# Patient Record
Sex: Female | Born: 1965 | Race: Black or African American | Hispanic: No | Marital: Single | Smoking: Never smoker
Health system: Southern US, Community
[De-identification: ages and names within clinical notes are randomized; demographics above are authoritative.]

## PROBLEM LIST (undated history)

## (undated) DIAGNOSIS — G459 Transient cerebral ischemic attack, unspecified: Secondary | ICD-10-CM

## (undated) DIAGNOSIS — I10 Essential (primary) hypertension: Secondary | ICD-10-CM

---

## 2009-11-25 ENCOUNTER — Inpatient Hospital Stay (HOSPITAL_COMMUNITY): Admission: AD | Admit: 2009-11-25 | Discharge: 2009-11-25 | Payer: Self-pay | Admitting: Obstetrics & Gynecology

## 2011-03-13 LAB — URINALYSIS, ROUTINE W REFLEX MICROSCOPIC
Glucose, UA: NEGATIVE mg/dL
Hgb urine dipstick: NEGATIVE
Protein, ur: NEGATIVE mg/dL

## 2011-03-13 LAB — POCT PREGNANCY, URINE: Preg Test, Ur: NEGATIVE

## 2013-08-19 ENCOUNTER — Other Ambulatory Visit: Payer: Self-pay | Admitting: Internal Medicine

## 2013-08-19 DIAGNOSIS — Z1231 Encounter for screening mammogram for malignant neoplasm of breast: Secondary | ICD-10-CM

## 2013-09-11 ENCOUNTER — Ambulatory Visit
Admission: RE | Admit: 2013-09-11 | Discharge: 2013-09-11 | Disposition: A | Discharge status: Home / Self Care | Payer: Medicaid Other | Source: Ambulatory Visit | Attending: Internal Medicine | Admitting: Internal Medicine

## 2013-09-11 DIAGNOSIS — Z1231 Encounter for screening mammogram for malignant neoplasm of breast: Secondary | ICD-10-CM

## 2014-02-24 ENCOUNTER — Emergency Department (HOSPITAL_COMMUNITY)
Admission: EM | Admit: 2014-02-24 | Discharge: 2014-02-24 | Disposition: A | Discharge status: Home / Self Care | Payer: Medicaid Other | Attending: Emergency Medicine | Admitting: Emergency Medicine

## 2014-02-24 ENCOUNTER — Emergency Department (HOSPITAL_COMMUNITY): Payer: Medicaid Other

## 2014-02-24 ENCOUNTER — Encounter (HOSPITAL_COMMUNITY): Payer: Self-pay | Admitting: Emergency Medicine

## 2014-02-24 DIAGNOSIS — Y92009 Unspecified place in unspecified non-institutional (private) residence as the place of occurrence of the external cause: Secondary | ICD-10-CM | POA: Insufficient documentation

## 2014-02-24 DIAGNOSIS — M25569 Pain in unspecified knee: Secondary | ICD-10-CM | POA: Insufficient documentation

## 2014-02-24 DIAGNOSIS — Y9389 Activity, other specified: Secondary | ICD-10-CM | POA: Insufficient documentation

## 2014-02-24 DIAGNOSIS — I10 Essential (primary) hypertension: Secondary | ICD-10-CM | POA: Insufficient documentation

## 2014-02-24 DIAGNOSIS — S86919A Strain of unspecified muscle(s) and tendon(s) at lower leg level, unspecified leg, initial encounter: Secondary | ICD-10-CM

## 2014-02-24 DIAGNOSIS — Z8673 Personal history of transient ischemic attack (TIA), and cerebral infarction without residual deficits: Secondary | ICD-10-CM | POA: Insufficient documentation

## 2014-02-24 DIAGNOSIS — IMO0002 Reserved for concepts with insufficient information to code with codable children: Secondary | ICD-10-CM | POA: Insufficient documentation

## 2014-02-24 DIAGNOSIS — R609 Edema, unspecified: Secondary | ICD-10-CM | POA: Insufficient documentation

## 2014-02-24 DIAGNOSIS — X58XXXA Exposure to other specified factors, initial encounter: Secondary | ICD-10-CM | POA: Insufficient documentation

## 2014-02-24 HISTORY — DX: Transient cerebral ischemic attack, unspecified: G45.9

## 2014-02-24 HISTORY — DX: Essential (primary) hypertension: I10

## 2014-02-24 MED ORDER — IBUPROFEN 800 MG PO TABS: 800.0000 mg | ORAL_TABLET | Freq: Three times a day (TID) | ORAL | Status: AC | PRN

## 2014-02-24 MED ORDER — IBUPROFEN 800 MG PO TABS
800.0000 mg | ORAL_TABLET | Freq: Once | ORAL | Status: AC
  Administered 2014-02-24: 800 mg via ORAL
  Filled 2014-02-24: qty 1

## 2014-02-24 MED ORDER — IBUPROFEN 800 MG PO TABS: 800.0000 mg | ORAL_TABLET | Freq: Three times a day (TID) | ORAL | Status: DC

## 2014-02-24 NOTE — ED Notes (Signed)
Pt reports R knee/calf pain since last night. Pain woke pt up last night, denies known injury. Sts pain is unchanged with movement or ambulation. Sts also had the same knee "give out" on her last month while walking.

## 2014-02-24 NOTE — ED Notes (Signed)
During interview with MD, pt sts pain started when walking to bathroom and is worse with walking, changed from RN interview.

## 2014-02-24 NOTE — ED Provider Notes (Signed)
CSN: [id number]     Arrival date & time 02/24/14  0857 History   First MD Initiated Contact with Patient 02/24/14 [phone]     Chief Complaint  Patient presents with  . Leg Pain     (Consider location/radiation/quality/duration/timing/severity/associated sxs/prior Treatment) Patient is a 48 y.o. female presenting with leg pain. The history is provided by the patient.  Leg Pain Location:  Knee and leg Injury: no   Leg location:  R lower leg Knee location:  R knee Pain details:    Quality:  Aching   Radiates to:  Does not radiate   Severity:  Mild   Onset quality:  Sudden   Duration:  1 day   Timing:  Constant (with ambulation)   Progression:  Unchanged Chronicity:  New Dislocation: no   Foreign body present:  No foreign bodies Prior injury to area:  No Relieved by:  Rest Worsened by:  Bearing weight Ineffective treatments:  None tried Associated symptoms: no decreased ROM, no fever, no neck pain, no numbness, no swelling and no tingling     Past Medical History  Diagnosis Date  . Hypertension   . TIA (transient ischemic attack)    History reviewed. No pertinent past surgical history. No family history on file. History  Substance Use Topics  . Smoking status: Not on file  . Smokeless tobacco: Not on file  . Alcohol Use: Not on file  Nonsmoker, social drinker OB History   Grav Para Term Preterm Abortions TAB SAB Ect Mult Living                 Review of Systems  Constitutional: Negative for fever.  Respiratory: Negative for cough and shortness of breath.   Musculoskeletal: Negative for neck pain.  All other systems reviewed and are negative.      Allergies  Review of patient's allergies indicates not on file.  Home Medications  No current outpatient prescriptions on file. BP 132/48  Pulse 88  Temp(Src) 98.7 F (37.1 C) (Oral)  Resp 20  SpO2 96% Physical Exam  Nursing note and vitals reviewed. Constitutional: She is oriented to person, place, and  time. She appears well-developed and well-nourished. No distress.  Morbidly obese  HENT:  Head: Normocephalic and atraumatic.  Eyes: EOM are normal. Pupils are equal, round, and reactive to light.  Neck: Normal range of motion. Neck supple.  Cardiovascular: Normal rate and regular rhythm.  Exam reveals no friction rub.   No murmur heard. Pulmonary/Chest: Effort normal and breath sounds normal. No respiratory distress. She has no wheezes. She has no rales.  Abdominal: Soft. She exhibits no distension. There is no tenderness. There is no rebound.  Musculoskeletal: Normal range of motion. She exhibits edema (trace pitting edema bilaterally).       Right knee: She exhibits normal range of motion, no swelling, no effusion, no ecchymosis and no deformity. Tenderness (inferior to patella, anterior superior tibia, mild lateral superior tibia/fibula) found. Patellar tendon tenderness noted.  No calf swelling  Neurological: She is alert and oriented to person, place, and time.  Skin: She is not diaphoretic.    ED Course  Procedures (including critical care time) Labs Review Labs Reviewed - No data to display Imaging Review No results found.   EKG Interpretation None      MDM   Final diagnoses:  Knee pain  Knee strain    48 year old female presents with right knee pain. She is morbidly obese. Pain started when she was walking to  the bathroom. No known injury, heavy lifting, trauma. No history of DVT. On exam vitals are stable. She has pain on her patellar tendon, superior tibia, lateral fibula. No swelling in the right leg when compared to the left leg. Both legs have trace bilateral pitting edema. No tenderness along the deep venous system or in the calf musculature posteriorly. She has 0 Wells criteria for DVT. I think this is likely musculoskeletal strain secondary to her morbidly obese. Will x-ray the knee and give NSAIDs. Will give ACE wrap to provide support. Knee xray normal. Given  NSAIDs and resource guide.    Dagmar HaitWilliam Kayven Aldaco, MD 02/24/14 1000

## 2014-02-24 NOTE — Discharge Instructions (Signed)
Knee Sprain A knee sprain is a tear in one of the strong, fibrous tissues that connect the bones (ligaments) in your knee. The severity of the sprain depends on how much of the ligament is torn. The tear can be either partial or complete. CAUSES  Often, sprains are a result of a fall or injury. The force of the impact causes the fibers of your ligament to stretch too much. This excess tension causes the fibers of your ligament to tear. SIGNS AND SYMPTOMS  You may have some loss of motion in your knee. Other symptoms include:  Bruising.  Pain in the knee area.  Tenderness of the knee to the touch.  Swelling. DIAGNOSIS  To diagnose a knee sprain, your health care provider will physically examine your knee. Your health care provider may also suggest an X-ray exam of your knee to make sure no bones are broken. TREATMENT  If your ligament is only partially torn, treatment usually involves keeping the knee in a fixed position (immobilization) or bracing your knee for activities that require movement for several weeks. To do this, your health care provider will apply a bandage, cast, or splint to keep your knee from moving and to support your knee during movement until it heals. For a partially torn ligament, the healing process usually takes 4 6 weeks. If your ligament is completely torn, depending on which ligament it is, you may need surgery to reconnect the ligament to the bone or reconstruct it. After surgery, a cast or splint may be applied and will need to stay on your knee for 4 6 weeks while your ligament heals. HOME CARE INSTRUCTIONS  Keep your injured knee elevated to decrease swelling.  To ease pain and swelling, apply ice to the injured area:  Put ice in a plastic bag.  Place a towel between your skin and the bag.  Leave the ice on for 20 minutes, 2 3 times a day.  Only take medicine for pain as directed by your health care provider.  Do not leave your knee unprotected until  pain and stiffness go away (usually 4 6 weeks).  If you have a cast or splint, do not allow it to get wet. If you have been instructed not to remove it, cover it with a plastic bag when you shower or bathe. Do not swim.  Your health care provider may suggest exercises for you to do during your recovery to prevent or limit permanent weakness and stiffness. SEEK IMMEDIATE MEDICAL CARE IF:  Your cast or splint becomes damaged.  Your pain becomes worse.  You have significant pain, swelling, or numbness below the cast or splint. MAKE SURE YOU:  Understand these instructions.  Will watch your condition.  Will get help right away if you are not doing well or get worse. Document Released: 11/27/2005 Document Revised: 09/17/2013 Document Reviewed: 07/09/2013 [facility] Patient Information 2014 [employer] [place].   Emergency Department Resource Guide 1) Find a Doctor and Pay Out of Pocket Although you won't have to find out who is covered by your insurance plan, it is a good idea to ask around and get recommendations. You will then need to call the office and see if the doctor you have chosen will accept you as a new patient and what types of options they offer for patients who are self-pay. Some doctors offer discounts or will set up payment plans for their patients who do not have insurance, but you will need to ask so you aren't  surprised when you get to your appointment.  2) Contact Your Local Health Department Not all health departments have doctors that can see patients for sick visits, but many do, so it is worth a call to see if yours does. If you don't know where your local health department is, you can check in your phone book. The CDC also has a tool to help you locate your state's health department, and many state websites also have listings of all of their local health departments.  3) Find a Walk-in Clinic If your illness is not likely to be very severe or complicated, you may want to  try a walk in clinic. These are popping up all over the country in pharmacies, drugstores, and shopping centers. They're usually staffed by nurse practitioners or physician assistants that have been trained to treat common illnesses and complaints. They're usually fairly quick and inexpensive. However, if you have serious medical issues or chronic medical problems, these are probably not your best option.  No Primary Care Doctor: - Call Health Connect at  [phone] - they can help you locate a primary care doctor that  accepts your insurance, provides certain services, etc. - Physician Referral Service- [phone]  Chronic Pain Problems: Organization         Address  Phone   Notes  [facility]  [phone] Patients need to be referred by their primary care doctor.   Medication Assistance: Organization         Address  Phone   Notes  [facility] [address] [place] [place], [place] [address] [phone] --Must be a resident of [facility] -- Must have NO insurance coverage whatsoever (no Medicaid/ Medicare, etc.) -- The pt. MUST have a primary care doctor that directs their care regularly and follows them in the community   [facility]ist  [phone]   [employer]  [phone]    Agencies that provide inexpensive medical care: Organization         Address  Phone   Notes  [facility]  [phone]   [facility]    [phone]   [facility] [address] [place], [place] [address] [phone]   [facility] [address] [place] [place] [phone]   Planned Parenthood    [phone]   [facility]    [phone]   [facility] and [facility]  [address], [place] Phone:  [phone], Fax:  [phone] Hours of Operation:  9 am - 6 pm, M-F.  Also accepts Medicaid/Medicare and self-pay.  [facility]  [address], [place] Phone: [phone], Fax: [phone]. Hours of Operation:  8:30 am - 5:30 pm, M-F.  Also accepts Medicaid and self-pay.  [facility] [place] [address], [place] Phone: [phone]   [facility] [address] Natasha Bence [place], [place] [phone], Ext. [phone] Mondays & Thursdays: 7-9 AM.  First 15 patients are seen on a first come, first serve basis.    Medicaid-accepting [facility] Providers:  Organization         Address  Phone   Notes  [facility] [address], [place] [phone] Also accepts self-pay patients.  [facility] [address] Laurell Josephs [place], [place]  [phone]   [facility]  [facility] [address], [place] [phone]   [facility] [address], [place] [phone]   Renaye Rakers [address], [place]   [phone] Only accepts [place] patients after they have their name applied to their card.   Self-Pay (no insurance) in [facility]:  Organization         Address  Phone   Notes  Sickle Cell Patients, [facility] [address] [place], [place] [phone]   [facility] [address] [place], [place] [phone]   [facility] [place]  [address], [place] [phone]   [facility]/Dr. Osei-Bonsu  [address] [place] or [address], [place] [phone] Phone number for both [place] and [place] locations is the same.  [facility] [address], [place] [phone]   [facility] [address] [place] or [address] [phone]   [facility] [address], [place] [phone], phone; [phone], fax  Sees patients 1st and 3rd Saturday of every month.  Must not qualify for public or private insurance (i.e. Medicaid, Medicare, [place] Health Choice, Veterans' Benefits)  Household income should be no more than 200% of the poverty level The clinic cannot treat you if you are pregnant or think you are pregnant  Sexually transmitted diseases are not treated at the clinic.    Dental Care: Organization         Address  Phone  Notes  [facility] Department of [facility] [address] [place], [place] [phone] Accepts children up to age 76 who are enrolled in [place] or [place] Health Choice; pregnant women with a Medicaid card; and children who have applied for Medicaid or [place] Health Choice, but were declined, whose parents can pay a reduced fee at time of service.  [facility] Department of [facility]  [address], [place] [phone] Accepts children up to age 56 who are enrolled in [place] or [place] Health Choice; pregnant women with a Medicaid card; and children who have applied for Medicaid or [place] Health Choice, but were declined, whose parents can pay a reduced fee at time of service.  [facility]  [address] [place], [place] [phone] Patients are seen by appointment only. Walk-ins are not accepted. [facility] will see patients 45 years of age and older. Monday - Tuesday (8am-5pm) Most Wednesdays (8:30-5pm) $30 per visit, cash only  [facility]  [address], [facility] [phone] Patients are seen by appointment only. Walk-ins are not accepted. [facility] will see patients 6 years of age and older. One Wednesday Evening (Monthly: Volunteer Based).  $30 per visit, cash only  [employer]  [phone] for adults; Children under age 34, call Graduate Pediatric Dentistry at [phone]. Children aged 4-14, please call [phone] to  request a pediatric application.  Dental services are provided in all areas of dental care including fillings, crowns and bridges, complete and partial dentures, implants, gum treatment, root canals, and extractions. Preventive care is also provided. Treatment is provided to both adults and children. Patients are selected via a lottery and there is often a waiting list.  [facility] [address], [place]  [phone] [email or web address]   [facility] [address], [place], [place] [phone], Ext. [phone] Second and Fourth Thursday of each month, opens at 6:30 AM; Clinic ends at 9 AM.  Patients are seen on a first-come first-served basis, and a limited number are seen during each clinic.   [facility]  [address], [place], [place] [phone]   Eligibility Requirements You must have lived in [place], [place], or [place] counties for at least the last three months.   You cannot be eligible for state or federal sponsored National Cityhealthcare insurance, including CIGNAVeterans Administration, [place]icaid, or [employer]Medicare.   You generally cannot be eligible for healthcare insurance through your employer.    How to apply: Eligibility screenings are held every Tuesday and Wednesday afternoon from 1:00 pm until 4:00 pm. You do not need an appointment for the interview!  [facility] [address], [place], [place] [phone]   [facility]  [phone]   [facility]  [phone]   [facility]  [phone]    Behavioral Health Resources in the Community: Intensive Outpatient Programs Organization         Address  Phone  Notes  [facility] [address], [place], [place] [phone]   [facility] [address], [place], [place] [phone]   [facility] [address], [place], [place]  [phone]   [facility] [address],  [place], [place] [phone] or [phone]   Substance Abuse Resources Organization         Address  Phone  Notes  [facility]  [phone]   [facility]  [phone]   [facility]  [phone]   [facility]  [phone]   Residential & Outpatient Substance Abuse Program  [phone]   Psychological Services Organization         Address  Phone  Notes  [facility]  [phone]- [phone]   [facility]  [phone]   [facility] [address], [place] [phone] or [phone]    Mobile Crisis Teams Organization         Address  Phone  Notes  [facility], Mobile Crisis Care Unit  [phone]   [facility]  [address] [place], [place] [phone]   Doristine LocksSharon DeEsch [address] [place] [phone]    Self-Help/Support Groups Organization         Address  Phone             Notes  [facility] [place] - variety of support groups  336- [phone] Call for more information  Narcotics Anonymous (NA), [facility] [address], [place]  2 meetings at this location   Statisticianesidential Treatment Programs Organization         Address  Phone  Notes  [facility] [address],    [place]  [phone]   [facility]  [address], [place] [phone], [place], [place] [phone]   [facility] [address], [place] [phone] Admissions: 8am-3pm M-F  [facility] [address]    [place], [place] [phone]   [facility] [address], [place], [place] [phone]   [facility] [address]  [place], [place] [phone]   [facility] - Intensive Outpatient [address] Laurell Josephs [address], [place], [place] [phone]   [facility] ([facility] [address] [place].,  [place], [place] [phone] or [phone]   [facility] ([facility]) [address] [place], [place] [phone] Accepts Medicaid  [facility] [address],  [place] [phone] Substance Abuse/Addiction Treatment   [facility] Organization         Address  Phone  Notes  [facility]  [phone]   Angie Fava, PhD [address] Ervin Knack [place], [place]   [phone] or [phone]   [facility]   [address] [place], [place] [phone]   [facility] [address] [place], [place] [phone] Insurance/Medicaid/sponsorship through [facility] and Families [address]                                    [place], [place] [phone] Therapy/tele-psych/case  [facility] [address], [place] [phone]    Dr. Lolly Mustache  [phone]   [facility]  [employer] [facility] 1) [address] [place] 2) [address] [place] 3)  [address], [place] [phone]  [phone]   [facility] Child Abuse Hotline 514-026-7607 or [phone] (After Hours)

## 2014-02-24 NOTE — ED Notes (Signed)
MD at bedside. 

## 2014-03-16 ENCOUNTER — Encounter (HOSPITAL_COMMUNITY): Payer: Self-pay | Admitting: Emergency Medicine

## 2014-03-16 ENCOUNTER — Emergency Department (HOSPITAL_COMMUNITY)
Admission: EM | Admit: 2014-03-16 | Discharge: 2014-03-16 | Disposition: A | Discharge status: Home / Self Care | Payer: Medicaid Other | Attending: Emergency Medicine | Admitting: Emergency Medicine

## 2014-03-16 ENCOUNTER — Other Ambulatory Visit (HOSPITAL_COMMUNITY): Payer: Self-pay | Admitting: Physician Assistant

## 2014-03-16 DIAGNOSIS — Z8673 Personal history of transient ischemic attack (TIA), and cerebral infarction without residual deficits: Secondary | ICD-10-CM | POA: Insufficient documentation

## 2014-03-16 DIAGNOSIS — Z79899 Other long term (current) drug therapy: Secondary | ICD-10-CM | POA: Insufficient documentation

## 2014-03-16 DIAGNOSIS — N6452 Nipple discharge: Secondary | ICD-10-CM

## 2014-03-16 DIAGNOSIS — I10 Essential (primary) hypertension: Secondary | ICD-10-CM | POA: Insufficient documentation

## 2014-03-16 DIAGNOSIS — N643 Galactorrhea not associated with childbirth: Secondary | ICD-10-CM | POA: Insufficient documentation

## 2014-03-16 DIAGNOSIS — Z7982 Long term (current) use of aspirin: Secondary | ICD-10-CM | POA: Insufficient documentation

## 2014-03-16 MED ORDER — CEPHALEXIN 500 MG PO CAPS: ORAL_CAPSULE | ORAL | Status: AC

## 2014-03-16 NOTE — ED Provider Notes (Signed)
CSN: [id number]     Arrival date & time 03/16/14  1144 History   First MD Initiated Contact with Patient 03/16/14 1157     Chief Complaint  Patient presents with  . Breast Discharge     (Consider location/radiation/quality/duration/timing/severity/associated sxs/prior Treatment) HPI Comments: Patient is a 48 year old female with history of hypertension and TIA who presents today with sudden onset of light brown discharge from the right nipple. This occurred just prior to arrival and has persisted. She is not currently having any discharge. She denies any pain. She had a normal mammogram 4 months ago. She does have a positive family history of her sister being diagnosed with breast cancer "years ago". She denies fever, chills, nausea, vomiting, abdominal pain, chest pain, shortness of breath, generalized malaise, body aches.  The history is provided by the patient. No language interpreter was used.    Past Medical History  Diagnosis Date  . Hypertension   . TIA (transient ischemic attack)    History reviewed. No pertinent past surgical history. No family history on file. History  Substance Use Topics  . Smoking status: Never Smoker   . Smokeless tobacco: Not on file  . Alcohol Use: Yes     Comment: occasionally   OB History   Grav Para Term Preterm Abortions TAB SAB Ect Mult Living                 Review of Systems  Constitutional: Negative for fever and chills.  Respiratory: Negative for shortness of breath.   Cardiovascular: Negative for chest pain.  Gastrointestinal: Negative for nausea, vomiting and abdominal pain.  Genitourinary:       Breast discharge  Musculoskeletal: Negative for myalgias.  All other systems reviewed and are negative.      Allergies  Review of patient's allergies indicates no known allergies.  Home Medications   Current Outpatient Rx  Name  Route  Sig  Dispense  Refill  . aspirin EC 81 MG tablet   Oral   Take 81 mg by mouth daily.        . hydrochlorothiazide (HYDRODIURIL) 25 MG tablet   Oral   Take 25 mg by mouth daily.         Marland Kitchen. ibuprofen (ADVIL,MOTRIN) 800 MG tablet   Oral   Take 1 tablet (800 mg total) by mouth every 8 (eight) hours as needed.   30 tablet   0   . lisinopril (PRINIVIL,ZESTRIL) 10 MG tablet   Oral   Take 10 mg by mouth daily.          BP 126/81  Pulse 85  Temp(Src) 97.9 F (36.6 C) (Oral)  Resp 20  SpO2 95%  LMP 02/09/2014 Physical Exam  Nursing note and vitals reviewed. Constitutional: She is oriented to person, place, and time. She appears well-developed and well-nourished. No distress.  Morbidly obese  HENT:  Head: Normocephalic and atraumatic.  Right Ear: External ear normal.  Left Ear: External ear normal.  Nose: Nose normal.  Mouth/Throat: Oropharynx is clear and moist.  Eyes: Conjunctivae are normal.  Neck: Normal range of motion.  Cardiovascular: Normal rate, regular rhythm and normal heart sounds.   Pulmonary/Chest: Effort normal and breath sounds normal. No stridor. No respiratory distress. She has no wheezes. She has no rales.  Abdominal: Soft. She exhibits no distension.  Genitourinary: No breast swelling, tenderness, discharge or bleeding.  On breast exam I did not appreciated lumps, erythema, induration, fluctuance, or discharge bilaterally.   Musculoskeletal: Normal range  of motion.  Neurological: She is alert and oriented to person, place, and time. She has normal strength.  Skin: Skin is warm and dry. She is not diaphoretic. No erythema.  Psychiatric: She has a normal mood and affect. Her behavior is normal.    ED Course  Procedures (including critical care time) Labs Review Labs Reviewed - No data to display Imaging Review No results found.   EKG Interpretation None      MDM   Final diagnoses:  Breast discharge    Patient presents to the emergency department complaining of sudden onset light brown discharge from her right nipple. No discharge  appreciated on my exam. At this point her complaint is not consistent with breast abscess, cellulitis. She was given a Keflex rx, but instructed to not take it unless she developed erythema, induration, purulent drainage, fevers, chills, malaise. A normal mammogram 4 months ago is reassuring, but the patient needs further imaging. She was given a referral to the breast center and an ultrasound was ordered. She will call the breast center upon leaving the emergency department. She will followup with her primary care physician. Recency return to the emergency department immediately were discussed. Vital signs stable for discharge. Discussed case with Dr. Lynelle Doctor who agrees with plan. Patient / Family / Caregiver informed of clinical course, understand medical decision-making process, and agree with plan.   Mora Bellman, PA-C 03/16/14 1251

## 2014-03-16 NOTE — ED Provider Notes (Signed)
Medical screening examination/treatment/procedure(s) were performed by non-physician practitioner and as supervising physician I was immediately available for consultation/collaboration.   Celene KrasJon R Colleene Swarthout, MD 03/16/14 [phone]

## 2014-03-16 NOTE — ED Notes (Signed)
Pt c/o light brown breast discharge that started about 30 mins ago. Denies pain states she has mammogram 4 months ago came back negative.

## 2014-03-17 ENCOUNTER — Other Ambulatory Visit (HOSPITAL_COMMUNITY): Payer: Self-pay | Admitting: Physician Assistant

## 2014-03-17 DIAGNOSIS — N6452 Nipple discharge: Secondary | ICD-10-CM

## 2015-11-05 IMAGING — CR DG KNEE COMPLETE 4+V*R*
4 series · 4 of 4 positions shown · non-contrast
Comparison: None.

CLINICAL DATA: Right knee pain, no injury

EXAM:
RIGHT KNEE - COMPLETE 4+ VIEW

[x knee ap right (1 of 3)]
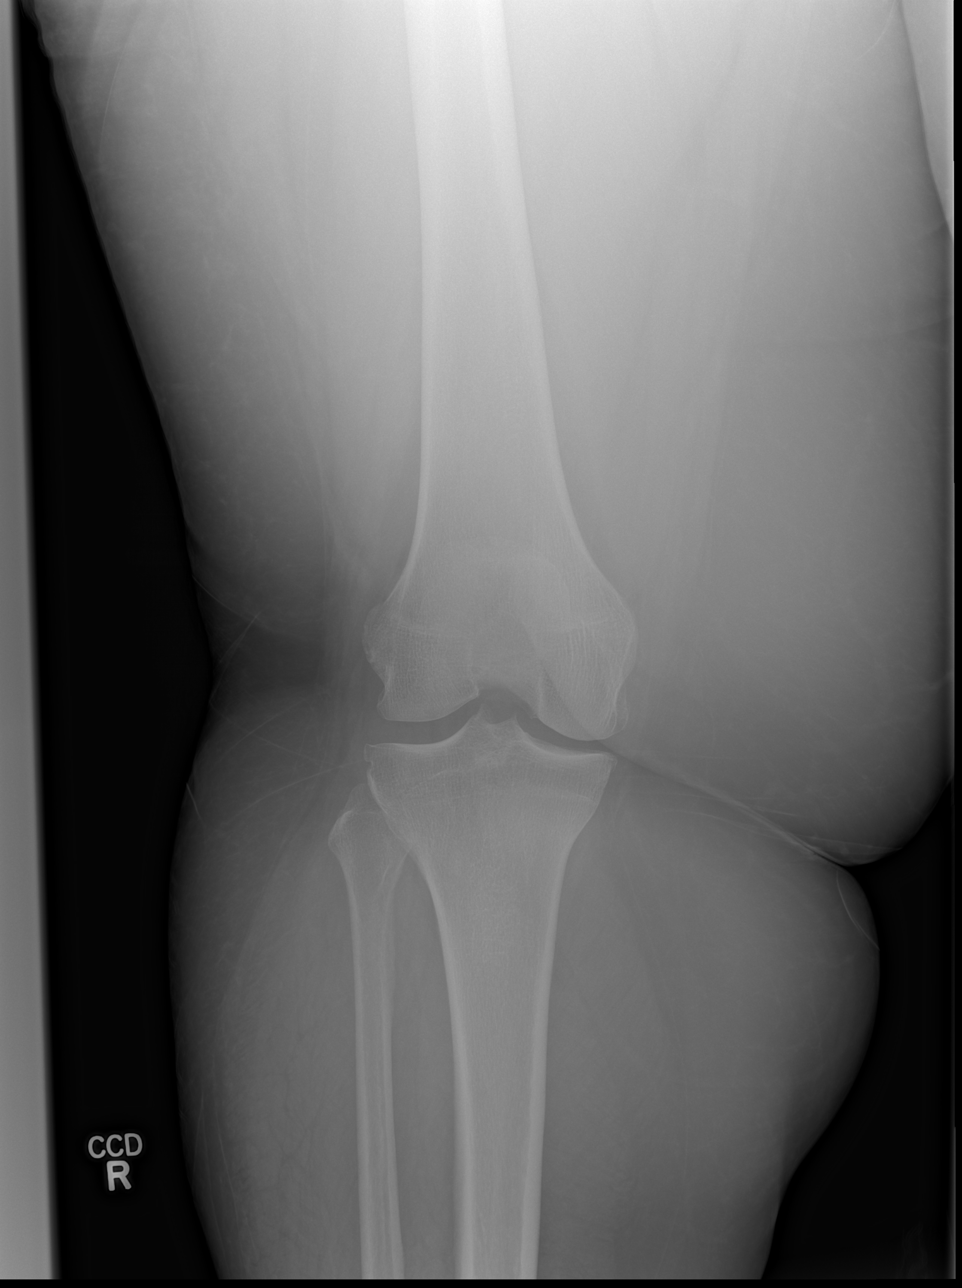

[x knee ap right (2 of 3)]
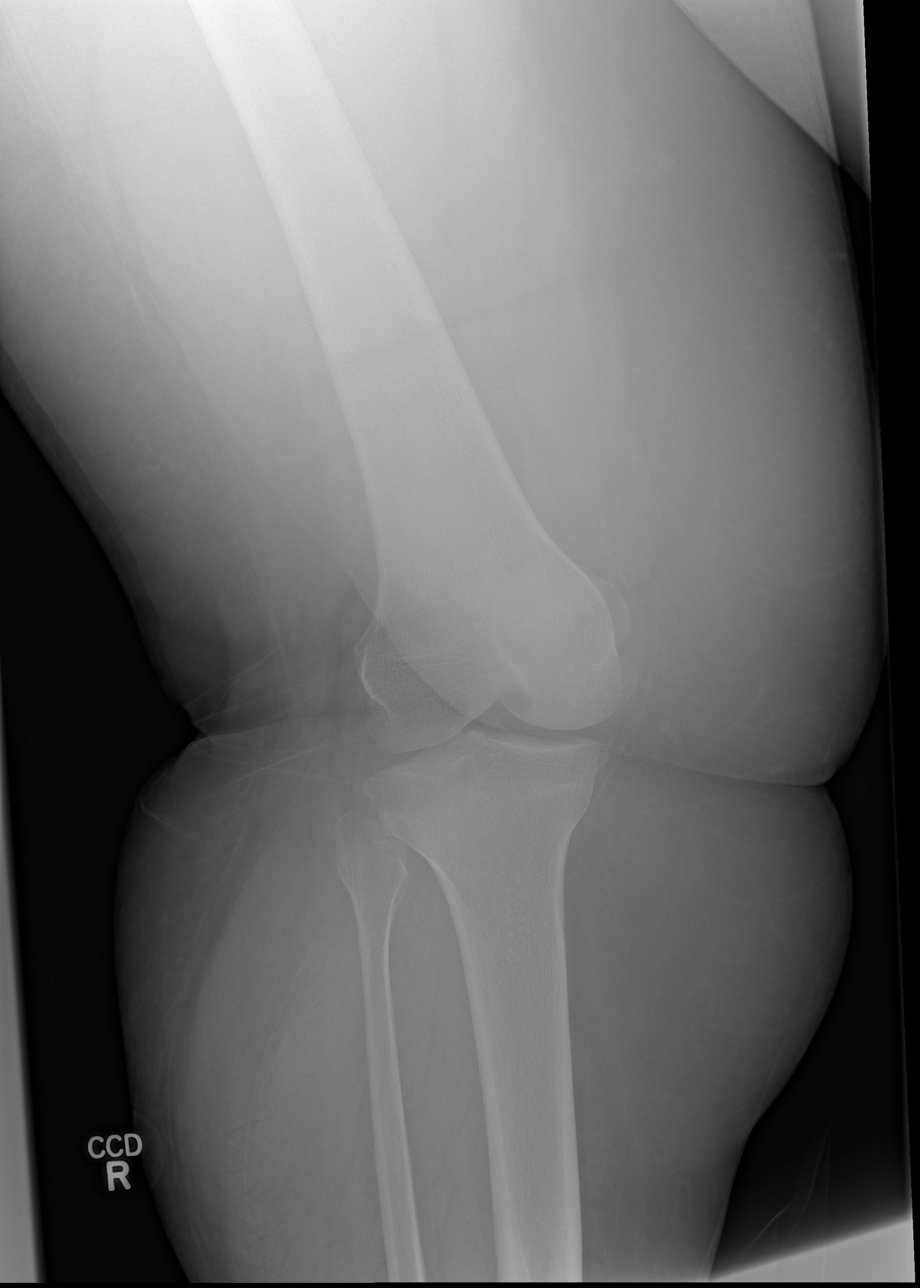

[x knee ap right (3 of 3)]
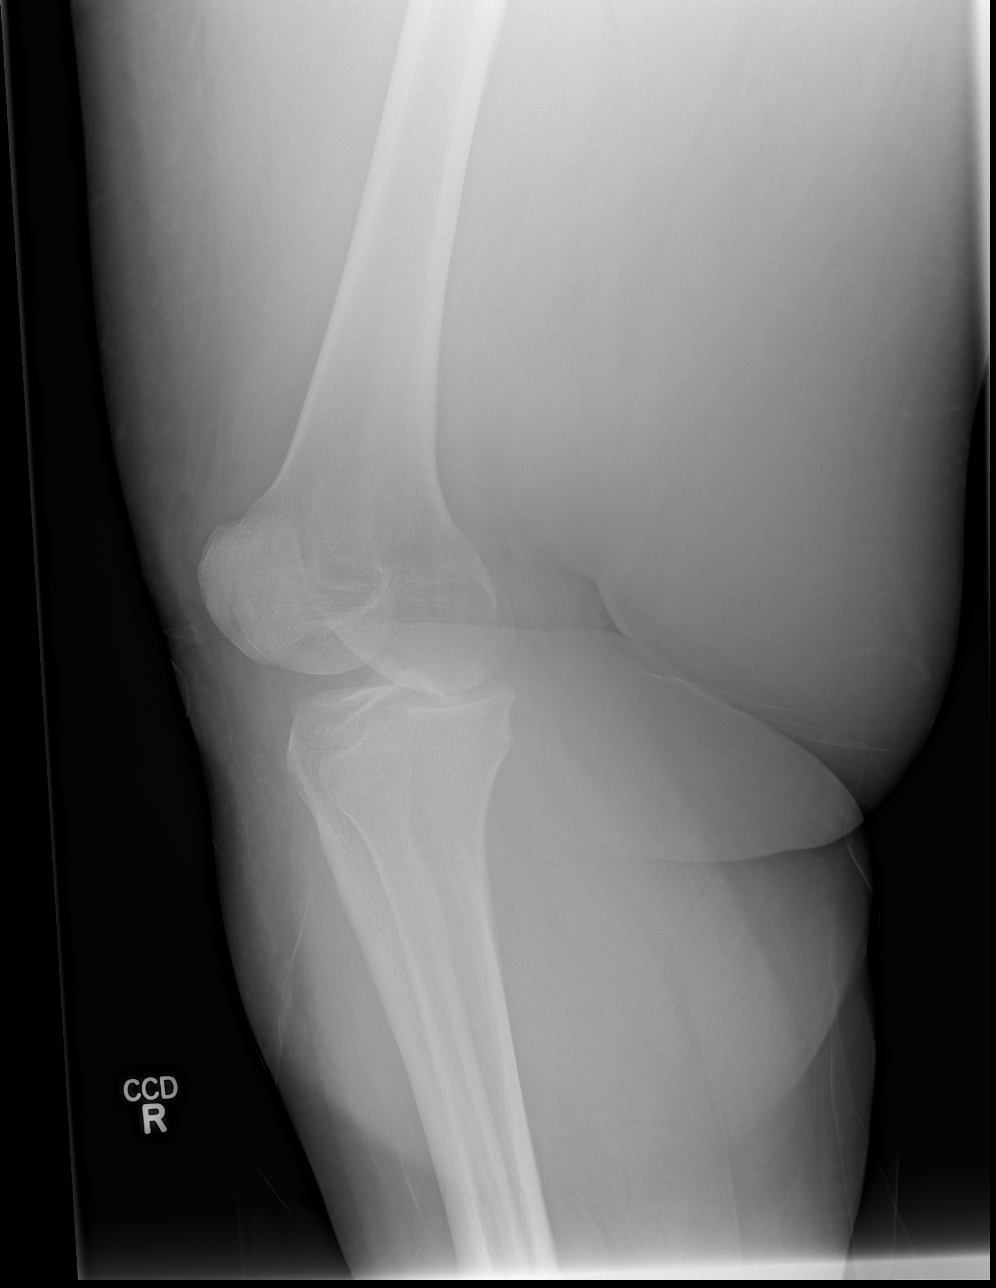

[x knee lat right]
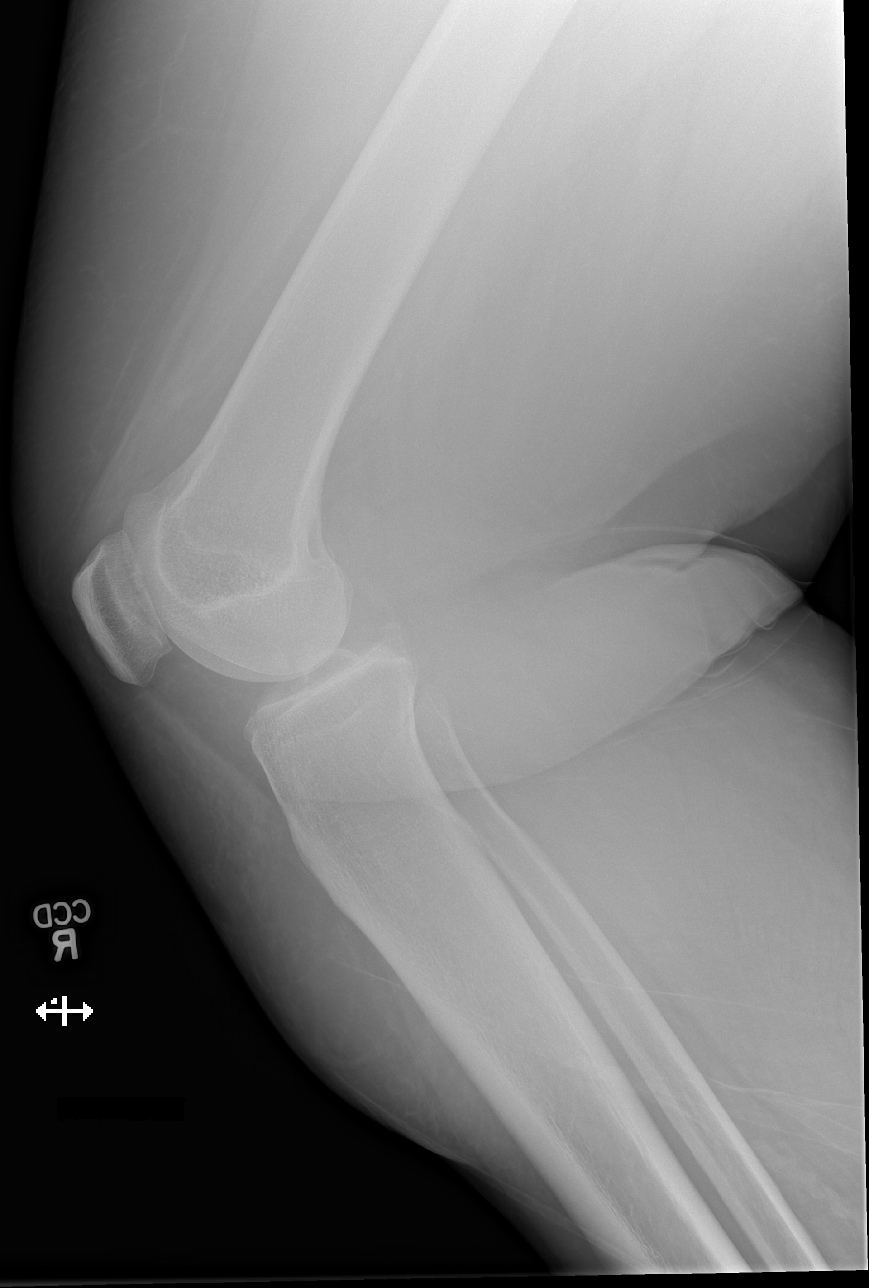

[4 of 4 positions shown; findings below may reference images not displayed]

FINDINGS: There is mild tricompartmental degenerative joint disease primarily
involving the medial compartment where there is more loss of joint
space present. No fracture is seen. No effusion is noted.
IMPRESSION: Mild tricompartmental degenerative joint disease primarily involving
the medial compartment.
